# Patient Record
Sex: Male | Born: 1970 | Race: Black or African American | Hispanic: No | Marital: Single | Smoking: Current every day smoker
Health system: Southern US, Community
[De-identification: ages and names within clinical notes are randomized; demographics above are authoritative.]

## PROBLEM LIST (undated history)

## (undated) DIAGNOSIS — I429 Cardiomyopathy, unspecified: Secondary | ICD-10-CM

## (undated) HISTORY — PX: CHOLECYSTECTOMY: SHX55

---

## 2015-11-20 ENCOUNTER — Emergency Department: Payer: Medicaid Other

## 2015-11-20 ENCOUNTER — Encounter: Payer: Self-pay | Admitting: Emergency Medicine

## 2015-11-20 ENCOUNTER — Observation Stay
Admission: EM | Admit: 2015-11-20 | Discharge: 2015-11-21 | Disposition: A | Discharge status: Home / Self Care | Payer: Medicaid Other | Attending: Internal Medicine | Admitting: Internal Medicine

## 2015-11-20 DIAGNOSIS — F17200 Nicotine dependence, unspecified, uncomplicated: Secondary | ICD-10-CM | POA: Diagnosis not present

## 2015-11-20 DIAGNOSIS — Z881 Allergy status to other antibiotic agents status: Secondary | ICD-10-CM | POA: Diagnosis not present

## 2015-11-20 DIAGNOSIS — I422 Other hypertrophic cardiomyopathy: Secondary | ICD-10-CM | POA: Insufficient documentation

## 2015-11-20 DIAGNOSIS — I493 Ventricular premature depolarization: Secondary | ICD-10-CM | POA: Insufficient documentation

## 2015-11-20 DIAGNOSIS — Z9049 Acquired absence of other specified parts of digestive tract: Secondary | ICD-10-CM | POA: Insufficient documentation

## 2015-11-20 DIAGNOSIS — R1011 Right upper quadrant pain: Secondary | ICD-10-CM | POA: Diagnosis present

## 2015-11-20 DIAGNOSIS — R000 Tachycardia, unspecified: Secondary | ICD-10-CM | POA: Diagnosis present

## 2015-11-20 DIAGNOSIS — R778 Other specified abnormalities of plasma proteins: Secondary | ICD-10-CM | POA: Diagnosis not present

## 2015-11-20 DIAGNOSIS — Z950 Presence of cardiac pacemaker: Secondary | ICD-10-CM | POA: Diagnosis not present

## 2015-11-20 DIAGNOSIS — R55 Syncope and collapse: Secondary | ICD-10-CM | POA: Diagnosis not present

## 2015-11-20 DIAGNOSIS — R112 Nausea with vomiting, unspecified: Secondary | ICD-10-CM | POA: Diagnosis not present

## 2015-11-20 DIAGNOSIS — I4581 Long QT syndrome: Secondary | ICD-10-CM | POA: Insufficient documentation

## 2015-11-20 DIAGNOSIS — R531 Weakness: Secondary | ICD-10-CM | POA: Diagnosis not present

## 2015-11-20 DIAGNOSIS — R634 Abnormal weight loss: Secondary | ICD-10-CM | POA: Diagnosis not present

## 2015-11-20 DIAGNOSIS — Z95810 Presence of automatic (implantable) cardiac defibrillator: Secondary | ICD-10-CM | POA: Diagnosis not present

## 2015-11-20 DIAGNOSIS — I517 Cardiomegaly: Secondary | ICD-10-CM | POA: Diagnosis not present

## 2015-11-20 DIAGNOSIS — Z7289 Other problems related to lifestyle: Secondary | ICD-10-CM

## 2015-11-20 DIAGNOSIS — Z789 Other specified health status: Secondary | ICD-10-CM

## 2015-11-20 HISTORY — DX: Cardiomyopathy, unspecified: I42.9

## 2015-11-20 LAB — TROPONIN I: Troponin I: 0.05 ng/mL — ABNORMAL HIGH (ref <0.031)

## 2015-11-20 LAB — COMPREHENSIVE METABOLIC PANEL
ALBUMIN: 4.5 g/dL (ref 3.5–5.0)
ALK PHOS: 65 U/L (ref 38–126)
ALT: 22 U/L (ref 17–63)
ANION GAP: 12 (ref 5–15)
AST: 29 U/L (ref 15–41)
BUN: 7 mg/dL (ref 6–20)
CO2: 23 mmol/L (ref 22–32)
Calcium: 9.1 mg/dL (ref 8.9–10.3)
Chloride: 108 mmol/L (ref 101–111)
Creatinine, Ser: 0.77 mg/dL (ref 0.61–1.24)
GFR calc Af Amer: >60 mL/min (ref >60)
GFR calc non Af Amer: >60 mL/min (ref >60)
GLUCOSE: 82 mg/dL (ref 65–99)
POTASSIUM: 3.6 mmol/L (ref 3.5–5.1)
SODIUM: 143 mmol/L (ref 135–145)
Total Bilirubin: 1.2 mg/dL (ref 0.3–1.2)
Total Protein: 8.1 g/dL (ref 6.5–8.1)

## 2015-11-20 LAB — CBC
HCT: 48.9 % (ref 40.0–52.0)
HEMOGLOBIN: 16.8 g/dL (ref 13.0–18.0)
MCH: 31 pg (ref 26.0–34.0)
MCHC: 34.3 g/dL (ref 32.0–36.0)
MCV: 90.3 fL (ref 80.0–100.0)
Platelets: 194 10*3/uL (ref 150–440)
RBC: 5.41 MIL/uL (ref 4.40–5.90)
RDW: 16 % — ABNORMAL HIGH (ref 11.5–14.5)
WBC: 7.5 10*3/uL (ref 3.8–10.6)

## 2015-11-20 LAB — ETHANOL: ALCOHOL ETHYL (B): 64 mg/dL — AB (ref <5)

## 2015-11-20 LAB — GLUCOSE, CAPILLARY: GLUCOSE-CAPILLARY: 77 mg/dL (ref 65–99)

## 2015-11-20 LAB — LIPASE, BLOOD: Lipase: 34 U/L (ref 11–51)

## 2015-11-20 MED ORDER — METOCLOPRAMIDE HCL 5 MG/ML IJ SOLN
10.0000 mg | Freq: Once | INTRAMUSCULAR | Status: AC
  Administered 2015-11-20: 10 mg via INTRAVENOUS
  Filled 2015-11-20: qty 2

## 2015-11-20 MED ORDER — SODIUM CHLORIDE 0.9 % IV BOLUS (SEPSIS)
1000.0000 mL | Freq: Once | INTRAVENOUS | Status: AC
  Administered 2015-11-20: 1000 mL via INTRAVENOUS

## 2015-11-20 MED ORDER — FENTANYL CITRATE (PF) 100 MCG/2ML IJ SOLN: 50.0000 ug | Freq: Once | INTRAMUSCULAR | Status: DC

## 2015-11-20 MED ORDER — ONDANSETRON HCL 4 MG/2ML IJ SOLN
4.0000 mg | Freq: Once | INTRAMUSCULAR | Status: AC
  Administered 2015-11-20: 4 mg via INTRAVENOUS
  Filled 2015-11-20: qty 2

## 2015-11-20 NOTE — ED Notes (Signed)
Pt presents to ED with c/o "falling asleep". Pt states his left side and lower leg feel numb at times. Pt states n/v at times. Per EMS pt was keeping his eyelids closed prior to arrival. Alert and oriented x3

## 2015-11-20 NOTE — ED Notes (Signed)
Pt lethargic at times ; each "passing out event" lasts 5-10 minutes then he wakes up and answers questions appropriately while maintaining eye contact. Pt asks for ice or covers then goes to sleep or lays quietly.

## 2015-11-20 NOTE — ED Provider Notes (Signed)
[facility] Emergency Department Provider Note  ____________________________________________  Time seen: Approximately 9:43 PM  I have reviewed the triage vital signs and the nursing notes.   HISTORY  Chief Complaint Weakness  History is limited because the patient has a bizarre affect and gives a different chief complaint to every person who asks him why he is here.  HPI Matthew Cowan is a 45 y.o. male with a history of cardiomyopathy status post AICD, S/P cholecystectomy, presenting with right upper quadrant pain associated with nausea vomiting and diarrhea, and possibly syncope. The patient describes that this afternoon he had the urge to have a bowel movement and when he was in the bathroom he began to have nausea and vomiting associated with a sharp right upper quadrant pain. This is similar to previous episodes that he has had since his cholecystectomy, but he is unable to tell me when his surgery was. His mother arrives and states that he had an episode of syncope. She reports that he has been having recurrent syncope" all his doctors at [facility] in [place] don't know why." His mother also reports drug abuse The patient denies any chest pain, palpitations, shortness of breath or lightheadedness. No fevers or chills.  To my triage nurse, he complained of left sided numbness, and he denied this to me.  Past Medical History  Diagnosis Date  . Cardiomyopathy (HCC)     There are no active problems to display for this patient.   Past Surgical History  Procedure Laterality Date  . Cholecystectomy      No current outpatient prescriptions on file.  Allergies Review of patient's allergies indicates not on file.  History reviewed. No pertinent family history.  Social History Social History  Substance Use Topics  . Smoking status: Current Every Day Smoker  . Smokeless tobacco: None  . Alcohol Use: Yes    Review of Systems Constitutional: No  fever/chills.Possible syncope. Eyes: No visual changes. ENT: No sore throat. No congestion or rhinorrhea. Cardiovascular: Denies chest pain. Denies palpitations. Respiratory: Denies shortness of breath.  No cough. Gastrointestinal: Right upper quadrant abdominal pain.  Positive nausea, positive vomiting.  Positive diarrhea.  No constipation. Genitourinary: Negative for dysuria. Musculoskeletal: Negative for back pain. Skin: Negative for rash. Neurological: Negative for headaches. No focal numbness, tingling or weakness.   10-point ROS otherwise negative.  ____________________________________________   PHYSICAL EXAM:  VITAL SIGNS: ED Triage Vitals  Enc Vitals Group     BP 11/20/15 2135 144/93 mmHg     Pulse Rate 11/20/15 2135 77     Resp 11/20/15 2135 20     Temp 11/20/15 2135 98.2 F (36.8 C)     Temp Source 11/20/15 2135 Oral     SpO2 11/20/15 2135 100 %     Weight 11/20/15 2135 180 lb (81.647 kg)     Height 11/20/15 2135  (1.803 m)     Head Cir --      Peak Flow --      Pain Score --      Pain Loc --      Pain Edu? --      Excl. in GC? --     Constitutional: Alert and oriented. Well appearing and in no acute distress. Answers questions appropriately. Eyes: Conjunctivae are normal.  EOMI. No scleral icterus. Head: Atraumatic. Nose: No congestion/rhinnorhea. Mouth/Throat: Mucous membranes are moist.  Neck: No stridor.  Supple.  No meningismus. Full range of motion without pain. Cardiovascular: Normal rate, regular rhythm.  No murmurs, rubs or gallops.  Respiratory: Normal respiratory effort.  No accessory muscle use or retractions. Lungs CTAB.  No wheezes, rales or ronchi. Gastrointestinal: Soft and nondistended.  Mild right upper quadrant tenderness to palpation without Murphy sign. No guarding or rebound.  No peritoneal signs. Musculoskeletal: No LE edema. No ttp in the calves or palpable cords.  Negative Homan's sign. Neurologic:  Alert and oriented 3. Speech  is clear. Face and smile are symmetric. Moves all extremities well.  Skin:  Skin is warm, dry and intact. No rash noted. Psychiatric: Bizarre affect with abnormal mood. The patient closes his eyes for most of the exam and at times he refuses to answer questions although he is hemodynamically stable during those periods of time. He occasionally blurts out profanities. And at different times has the apparent acute onset of pain in multiple different locations of his body that are not consistent.  ____________________________________________   LABS (all labs ordered are listed, but only abnormal results are displayed)  Labs Reviewed  CBC - Abnormal; Notable for the following:    RDW 16.0 (*)    All other components within normal limits  ETHANOL - Abnormal; Notable for the following:    Alcohol, Ethyl (B) 64 (*)    All other components within normal limits  GLUCOSE, CAPILLARY  COMPREHENSIVE METABOLIC PANEL  LIPASE, BLOOD  TROPONIN I  URINE DRUG SCREEN, QUALITATIVE ([facility] ONLY)   ____________________________________________  EKG  ED ECG REPORT I, Rockne MenghiniNorman, Anne-Caroline, the attending physician, personally viewed and interpreted this ECG.   Date: 11/20/2015  EKG Time: 2123  Rate: 82  Rhythm:  paced  Axis: paced  Intervals:none  ST&T Change: paced  ____________________________________________  RADIOLOGY  Dg Chest Portable 1 View  11/20/2015  CLINICAL DATA:  45 year old male with syncope EXAM: PORTABLE CHEST 1 VIEW COMPARISON:  None FINDINGS: Single-view of the chest demonstrate clear lungs. There is no pleural effusion or pneumothorax. There is mild cardiomegaly. Left pectoral pacemaker device. No acute osseous pathology. IMPRESSION: No acute pulmonary process. Mild cardiomegaly. Electronically Signed   By: Elgie CollardArash  Radparvar M.D.   On: 11/20/2015 22:02    ____________________________________________   PROCEDURES  Procedure(s) performed: None  Critical Care performed:  No ____________________________________________   INITIAL IMPRESSION / ASSESSMENT AND PLAN / ED COURSE  Pertinent labs & imaging results that were available during my care of the patient were reviewed by me and considered in my medical decision making (see chart for details).  45 y.o. male with a history of CHF and cardiomyopathy status post AICD presenting with right upper quadrant pain, nausea and vomiting, diarrhea, and possible syncopal episode. His EKG does show that his pacer is working with a rate of 82. I will get the nurses to interrogate the pacer. We will get additional blood work, but hold off on imaging at this time.  ----------------------------------------- 11:01 PM on 11/20/2015 -----------------------------------------  The patient abruptly developed a heart rate of 142. Patient was completely asymptomatic during this episode and it was brief and self-limited. The following is the EKG interpretation: ED ECG REPORT I, Rockne MenghiniNorman, Anne-Caroline, the attending physician, personally viewed and interpreted this ECG.   Date: 11/20/2015  EKG Time: 2249  Rate: 142  Rhythm: sinus tachycardia  Axis: Normal  Intervals:Prolonged QTC  ST&T Change: Isolated 2 mm ST elevation in V3.  Upon immediately going to the patient's room, his heart rate had now normalized to 70 bpm and he no longer had the morphologic abnormality seen on the brief strip  from his tachycardia. We'll continue to monitor him and admit him to the hospital.   ____________________________________________  FINAL CLINICAL IMPRESSION(S) / ED DIAGNOSES  Final diagnoses:  Alcohol use (HCC)  Syncope, unspecified syncope type  Right upper quadrant abdominal pain  Sinus tachycardia (HCC)      NEW MEDICATIONS STARTED DURING THIS VISIT:  New Prescriptions   No medications on file     Rockne Menghini, MD 11/20/15 2308

## 2015-11-20 NOTE — ED Notes (Signed)
Interrogating pacemaker.

## 2015-11-20 NOTE — H&P (Addendum)
[facility] [place] at [facility]   PATIENT NAME: Matthew Cowan    MR#:  [id number]  DATE OF BIRTH:  1970-09-07  DATE OF ADMISSION:  11/20/2015  PRIMARY CARE PHYSICIAN: Pcp Not In System    REQUESTING/REFERRING PHYSICIAN: Dr Sharma Covert  CHIEF COMPLAINT:   Nausea vomiting today HISTORY OF PRESENT ILLNESS:  Matthew Cowan  is a 45 y.o. 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 Patient reports drinking 3 cans of beer earlier in the day. He denies any drug use to me. He has an episode where he does not respond for a few seconds. His grandma states he does this all the time and is had extensive workup at [facility] and they are not able to determine why he has his episodes where he falls asleep for a few seconds to minutes and then he wakes up by himself. His vitals remained stable during this episode while I was in the room. Patient reports having some trouble breathing however his sats are 100% on room air. He likely is dry heaving which is causing him symptoms of shortness of breath. He had troponin of 0.05. Patient denies any chest pain. He is being admitted for overnight observation for nausea and vomiting and epigastric discomfort.  PAST MEDICAL HISTORY:   Past Medical History  Diagnosis Date  . Cardiomyopathy (HCC)     PAST SURGICAL HISTOIRY:   Past Surgical History  Procedure Laterality Date  . Cholecystectomy      SOCIAL HISTORY:   Social History  Substance Use Topics  . Smoking status: Current Every Day Smoker  . Smokeless tobacco: Not on file  . Alcohol Use: Yes    FAMILY HISTORY:  History reviewed. No pertinent family history.  DRUG ALLERGIES:   Allergies  Allergen Reactions  . Tetracyclines & Related Hives     REVIEW OF SYSTEMS:  Review of Systems  Constitutional: Negative for fever, chills and weight loss.  HENT: Negative for ear discharge, ear pain and nosebleeds.   Eyes: Negative for blurred vision, pain and discharge.  Respiratory: Negative for sputum production, shortness of breath, wheezing and stridor.   Cardiovascular: Negative for chest pain, palpitations, orthopnea and PND.  Gastrointestinal: Positive for nausea and vomiting. Negative for abdominal pain and diarrhea.  Genitourinary: Negative for urgency and frequency.  Musculoskeletal: Positive for back pain. Negative for joint pain.  Neurological: Positive for weakness. Negative for sensory change, speech change and focal weakness.  Psychiatric/Behavioral: Negative for depression and hallucinations. The patient is not nervous/anxious.   All other systems reviewed and are negative.    MEDICATIONS AT HOME:   Prior to Admission medications   Medication Sig Start Date End Date Taking? Authorizing Provider  carvedilol (COREG) 12.5 MG tablet Take 12.5 mg by mouth 2 (two) times daily with a meal.   Yes Historical Provider, MD  furosemide (LASIX) 40 MG tablet Take 40 mg by mouth daily.   Yes Historical Provider, MD  losartan (COZAAR) 50 MG tablet Take 50 mg by mouth daily.   Yes Historical Provider, MD  mirtazapine (REMERON) 15 MG tablet Take 15 mg by mouth at bedtime.   Yes Historical Provider, MD  sotalol (BETAPACE) 120 MG tablet Take 120 mg by mouth 2 (two) times daily.   Yes Historical  Provider, MD  spironolactone (ALDACTONE) 25 MG tablet Take 25 mg by mouth daily.   Yes Historical Provider, MD  sucralfate (CARAFATE) 1 g tablet Take 1 g by mouth 2 (two) times daily.   Yes Historical Provider, MD      VITAL SIGNS:  Blood pressure 155/100, pulse 66, temperature 98.2 F (36.8 C), temperature source Oral, resp. rate 11, height  (1.803 m), weight 81.647 kg (180 lb), SpO2 85 %.  PHYSICAL EXAMINATION:  GENERAL:  45  y.o.-year-old patient lying in the bed with no acute distress.  EYES: Pupils equal, round, reactive to light and accommodation. No scleral icterus. Extraocular muscles intact.  HEENT: Head atraumatic, normocephalic. Oropharynx and nasopharynx clear.  NECK:  Supple, no jugular venous distention. No thyroid enlargement, no tenderness.  LUNGS: Normal breath sounds bilaterally, no wheezing, rales,rhonchi or crepitation. No use of accessory muscles of respiration.  CARDIOVASCULAR: S1, S2 normal. No murmurs, rubs, or gallops.  ABDOMEN: Soft, nontender, nondistended. Bowel sounds present. No organomegaly or mass.  EXTREMITIES: No pedal edema, cyanosis, or clubbing.  NEUROLOGIC: Cranial nerves II through XII are intact. Muscle strength 5/5 in all extremities. Sensation intact. Gait not checked.  PSYCHIATRIC: The patient is alert and oriented x 3.  SKIN: No obvious rash, lesion, or ulcer.   LABORATORY PANEL:   CBC  Recent Labs Lab 11/20/15 2240  WBC 7.5  HGB 16.8  HCT 48.9  PLT 194   ------------------------------------------------------------------------------------------------------------------  Chemistries   Recent Labs Lab 11/20/15 2240  NA 143  K 3.6  CL 108  CO2 23  GLUCOSE 82  BUN 7  CREATININE 0.77  CALCIUM 9.1  AST 29  ALT 22  ALKPHOS 65  BILITOT 1.2   ------------------------------------------------------------------------------------------------------------------  Cardiac Enzymes  Recent Labs Lab 11/20/15 2240  TROPONINI 0.05*   ------------------------------------------------------------------------------------------------------------------  RADIOLOGY:  Dg Chest Portable 1 View  11/20/2015  CLINICAL DATA:  45 year old male with syncope EXAM: PORTABLE CHEST 1 VIEW COMPARISON:  None FINDINGS: Single-view of the chest demonstrate clear lungs. There is no pleural effusion or pneumothorax. There is mild cardiomegaly. Left pectoral pacemaker device. No acute  osseous pathology. IMPRESSION: No acute pulmonary process. Mild cardiomegaly. Electronically Signed   By: Elgie Collard M.D.   On: 11/20/2015 22:02    EKG:  First EKG shows atrial paced complexes. Multiple PVCs. Right axis deviation. Second EKG shows sinus tachycardia heart rate in the 140s. PVCs.  IMPRESSION AND PLAN:   Jourdin Connors  is a 45 y.o. male with a known history ofCardiac myopathy status post AICD, history of cholecystectomy comes to the emergency room after having nausea or vomiting today while he was visiting his grandmother's place of [place]. Patient reports drinking 3 cans of beer earlier in the day. He denies any drug use to me.   1. Intractable nausea vomiting Admit to medical floor withoff unit telemetry IV fluids for hydration. When necessary Reglan/Zofran.  2. History of cardiomyopathy status post AICD\ Patient's defibrillator was interrogated and no major activity was noted.  3. Transient tachycardia in the emergency room with mild elevated troponin 0.05 -No history of chest pain -We'll trend cardiac enzymes 2. -Consider cardiology consultation if needed  4. Positive blood alcohol level. Patient reports drinking 3 beers today. Family reported earlier to the ER physician he has history of drug abuse.  Urine drug screen pending.  5. DVT prophylaxis subcutaneous Lovenox  Above was discussed with patient patient's grandmother and girlfriend was present emergency room All the records are reviewed and case  discussed with ED provider. Management plans discussed with the patient, family and they are in agreement.  CODE STATUS: Full  TOTAL TIME TAKING CARE OF THIS PATIENT: 45 minutes.    Aureliano Oshields M.D on 11/20/2015 at 11:44 PM  Between 7am to 6pm - Pager - [phone]  After 6pm go to [email or web address] - password [email or web address]  Fabio Neighborsagle  Hospitalists  Office  [phone]  CC: Primary care physician; Pcp Not In System

## 2015-11-21 LAB — URINE DRUG SCREEN, QUALITATIVE (ARMC ONLY)
Amphetamines, Ur Screen: NOT DETECTED
BARBITURATES, UR SCREEN: NOT DETECTED
BENZODIAZEPINE, UR SCRN: NOT DETECTED
Cannabinoid 50 Ng, Ur ~~LOC~~: POSITIVE — AB
Cocaine Metabolite,Ur ~~LOC~~: NOT DETECTED
MDMA (Ecstasy)Ur Screen: NOT DETECTED
METHADONE SCREEN, URINE: NOT DETECTED
OPIATE, UR SCREEN: NOT DETECTED
PHENCYCLIDINE (PCP) UR S: NOT DETECTED
Tricyclic, Ur Screen: NOT DETECTED

## 2015-11-21 LAB — TROPONIN I
TROPONIN I: 0.06 ng/mL — AB (ref <0.031)
TROPONIN I: 0.05 ng/mL — AB (ref <0.031)

## 2015-11-21 MED ORDER — OXYCODONE HCL 5 MG PO TABS
5.0000 mg | ORAL_TABLET | ORAL | Status: DC | PRN
  Administered 2015-11-21: 5 mg via ORAL
  Filled 2015-11-21: qty 1

## 2015-11-21 MED ORDER — ACETAMINOPHEN 650 MG RE SUPP: 650.0000 mg | Freq: Four times a day (QID) | RECTAL | Status: DC | PRN

## 2015-11-21 MED ORDER — MIRTAZAPINE 15 MG PO TABS
15.0000 mg | ORAL_TABLET | Freq: Every day | ORAL | Status: DC
  Administered 2015-11-21: 15 mg via ORAL
  Filled 2015-11-21: qty 1

## 2015-11-21 MED ORDER — ENOXAPARIN SODIUM 40 MG/0.4ML ~~LOC~~ SOLN: 40.0000 mg | SUBCUTANEOUS | Status: DC

## 2015-11-21 MED ORDER — SUCRALFATE 1 G PO TABS
1.0000 g | ORAL_TABLET | Freq: Two times a day (BID) | ORAL | Status: DC
  Administered 2015-11-21 (×2): 1 g via ORAL
  Filled 2015-11-21 (×2): qty 1

## 2015-11-21 MED ORDER — SOTALOL HCL 120 MG PO TABS
120.0000 mg | ORAL_TABLET | Freq: Two times a day (BID) | ORAL | Status: DC
  Administered 2015-11-21 (×2): 120 mg via ORAL
  Filled 2015-11-21 (×2): qty 1

## 2015-11-21 MED ORDER — CARVEDILOL 12.5 MG PO TABS
12.5000 mg | ORAL_TABLET | Freq: Two times a day (BID) | ORAL | Status: DC
  Administered 2015-11-21: 12.5 mg via ORAL
  Filled 2015-11-21: qty 1

## 2015-11-21 MED ORDER — LOSARTAN POTASSIUM 50 MG PO TABS
50.0000 mg | ORAL_TABLET | Freq: Every day | ORAL | Status: DC
Start: 2015-11-21 — End: 2015-11-21
  Administered 2015-11-21: 50 mg via ORAL
  Filled 2015-11-21: qty 1

## 2015-11-21 MED ORDER — SPIRONOLACTONE 25 MG PO TABS
25.0000 mg | ORAL_TABLET | Freq: Every day | ORAL | Status: DC
  Administered 2015-11-21: 25 mg via ORAL
  Filled 2015-11-21: qty 1

## 2015-11-21 MED ORDER — PANTOPRAZOLE SODIUM 40 MG PO TBEC: 40.0000 mg | DELAYED_RELEASE_TABLET | Freq: Two times a day (BID) | ORAL | Status: AC

## 2015-11-21 MED ORDER — ACETAMINOPHEN 325 MG PO TABS: 650.0000 mg | ORAL_TABLET | Freq: Four times a day (QID) | ORAL | Status: DC | PRN

## 2015-11-21 MED ORDER — SODIUM CHLORIDE 0.9 % IV SOLN
Freq: Once | INTRAVENOUS | Status: AC
  Administered 2015-11-21: 01:00:00 via INTRAVENOUS

## 2015-11-21 MED ORDER — ONDANSETRON HCL 4 MG PO TABS: 4.0000 mg | ORAL_TABLET | Freq: Four times a day (QID) | ORAL | Status: DC | PRN

## 2015-11-21 MED ORDER — ONDANSETRON HCL 4 MG/2ML IJ SOLN: 4.0000 mg | Freq: Four times a day (QID) | INTRAMUSCULAR | Status: DC | PRN

## 2015-11-21 NOTE — Plan of Care (Signed)
Problem: Safety: Goal: Ability to remain free from injury will improve Outcome: Progressing Patient will call for help

## 2015-11-21 NOTE — Progress Notes (Signed)
Patient discharged via wheelchair and private vehicle. IV removed and catheter intact. All discharge instructions given and patient verbalizes understanding. Tele removed and returned. Prescriptions given to patient No distress noted.   

## 2015-11-21 NOTE — ED Notes (Signed)
Transporting patient to 2A 246

## 2015-11-21 NOTE — Progress Notes (Signed)
Patient admitted to room 246 with the diagnosis of N/V. Alert and oriented x 4. Denied any acute pain. No respiratory distress noted.  Patient oriented to his room staff ascom/call bell. Tele box verified by the RN and  Misty StanleyLisa. R. RN. Skin assessment done with Roderick PeeLisa R. RN. No skin issues of concern noted. Girlfriend at bedside. Will continue to monitor.

## 2015-11-21 NOTE — Progress Notes (Signed)
Initial Nutrition Assessment  DOCUMENTATION CODES:   Not applicable  INTERVENTION:   -Cater to pt preferences on 2g Na diet order -Will send [employer] Instant Breakfast daily for added nutrition   NUTRITION DIAGNOSIS:   Unintentional weight loss related to altered GI function as evidenced by per patient/family report, percent weight loss.  GOAL:   Patient will meet greater than or equal to 90% of their needs  MONITOR:   PO intake, Supplement acceptance, Labs, I & O's, Weight trends  REASON FOR ASSESSMENT:   Malnutrition Screening Tool    ASSESSMENT:   Pt is 45 y.o. male with a known history ofCardiac myopathy status post AICD, history of cholecystectomy admitted after having nausea or vomiting  Past Medical History  Diagnosis Date  . Cardiomyopathy [facility](HCC)     Diet Order:  Diet 2 gram sodium Room service appropriate?: Yes; Fluid consistency:: Thin  Pt ate 0% of breakfast this morning, has been sipping liquids per report.   Pt reports appetite has been good PTA but he has not been able to keep what he eats down. Pt reports that it has been off and on since he had his gall bladder removed in 2014 but worse PTA.   Medications: Carafate, Remeron  Labs: reviewed   Gastrointestinal Profile: pt reports no nausea on visit Last BM:  11/19/2015   Nutrition-Focused Physical Exam Findings: Nutrition-Focused physical exam completed. Findings are WDL for fat depletion, muscle depletion, and edema.    Weight Change: Pt reports weight of 326lbs in 2014 prior to gallbladder removal (46% weight loss in 3 years).  Pt reports more recently having a weight of 180lbs 2 months ago (3% weight loss in 2 months)   Skin:  Reviewed, no issues   Height:   Ht Readings from Last 1 Encounters:  11/20/15 5\' 11"  (1.803 m)    Weight:   Wt Readings from Last 1 Encounters:  11/21/15 175 lb 3.2 oz (79.47 kg)     BMI:  Body mass index is 24.45 kg/(m^2).  Estimated Nutritional  Needs:   Kcal:  1975-2370kcals  Protein:  63-80g protein  Fluid:  >2L fluid  EDUCATION NEEDS:   No education needs identified at this time  Leda QuailAllyson Shaunita Seney, RD, LDN Pager [phone] Weekend/On-Call Pager [phone]

## 2015-11-21 NOTE — Discharge Instructions (Signed)
°  DIET:  Cardiac diet  DISCHARGE CONDITION:  Stable  ACTIVITY:  Activity as tolerated  OXYGEN:  Home Oxygen: No.   Oxygen Delivery: room air  DISCHARGE LOCATION:  home   If you experience worsening of your admission symptoms, develop shortness of breath, life threatening emergency, suicidal or homicidal thoughts you must seek medical attention immediately by calling 911 or calling your MD immediately  if symptoms less severe.   QUIT ALCOHOL.  You will need an endoscopy if symptoms do not improve in 4 weeks. This can be set up by you primary physician.

## 2015-11-23 NOTE — Discharge Summary (Signed)
[facility] [place] at [facility]   PATIENT NAME: Matthew HouseholderGregory Cowan    MR#:  [id number]  DATE OF BIRTH:  Oct 16, 1970  DATE OF ADMISSION:  11/20/2015 ADMITTING PHYSICIAN: Enedina FinnerSona Patel, MD  DATE OF DISCHARGE: 11/21/2015 11:49 AM  PRIMARY CARE PHYSICIAN: Pcp Not In System   ADMISSION DIAGNOSIS:  Sinus tachycardia (HCC) [R00.0] Alcohol use (HCC) [Z78.9] Right upper quadrant abdominal pain [R10.11] Syncope, unspecified syncope type [R55]  DISCHARGE DIAGNOSIS:  Active Problems:   Nausea and vomiting   SECONDARY DIAGNOSIS:   Past Medical History  Diagnosis Date  . Cardiomyopathy [facility](HCC)      ADMITTING HISTORY  Matthew HouseholderGregory Mackowski is a 45 y.o. male with a known history of Familial hypertrophic Cardiac myopathy status [facility] and history of cholecystectomy comes to the emergency room after having nausea or vomiting today while he was visiting his grandmother's place of [place]. Patient reports drinking 3 cans of beer earlier in the day. He denies any drug use to me. He has an episode where he does not respond for a few seconds. His grandma states he does this all the time and is [facility] and they are not able to determine why he has his episodes where he falls asleep for a few seconds to minutes and then he wakes up by himself. His vitals remained stable during this episode while I was in the room. Patient reports having some trouble breathing however his sats are 100% on room air. He likely is dry heaving which is causing him symptoms of shortness of breath. He had troponin of 0.05. Patient denies any chest pain. He is being admitted for overnight observation for nausea and vomiting and epigastric discomfort.  HOSPITAL COURSE:   *  GERD  patient was treated with symptomatic management. He likely had gastritis due to alcohol intake.  Presently plan is for trial of PPIs for 4 weeks and EGD no improvement.  Counseled to  quit marijuana and alcohol.  *  Mild elevation of troponin  0.04. Stable on serial troponin. No EKG changes. His pain was a burning sensation likely due to his reflux and GERD. Resolve by the time of discharge.   Stable for discharge home. Followup with PCP. May need referral for EGD if no improvement.  CONSULTS OBTAINED:     DRUG ALLERGIES:   Allergies  Allergen Reactions  . Tetracyclines & Related Hives    DISCHARGE MEDICATIONS:   Discharge Medication List as of 11/21/2015 10:28 AM    START taking these medications   Details  pantoprazole (PROTONIX) 40 MG tablet Take 1 tablet (40 mg total) by mouth 2 (two) times daily before a meal., Starting 11/21/2015, Until Discontinued, Print      CONTINUE these medications which have NOT CHANGED   Details  carvedilol (COREG) 12.5 MG tablet Take 12.5 mg by mouth 2 (two) times daily with a meal., Until Discontinued, Historical Med    furosemide (LASIX) 40 MG tablet Take 40 mg by mouth daily., Until Discontinued, Historical Med    losartan (COZAAR) 50 MG tablet Take 50 mg by mouth daily., Until Discontinued, Historical Med    mirtazapine (REMERON) 15 MG tablet Take 15 mg by mouth at bedtime., Until Discontinued, Historical Med    sotalol (BETAPACE) 120 MG tablet Take 120 mg by mouth 2 (two) times daily., Until Discontinued, Historical Med    spironolactone (ALDACTONE) 25 MG tablet Take 25 mg by mouth  daily., Until Discontinued, Historical Med    sucralfate (CARAFATE) 1 g tablet Take 1 g by mouth 2 (two) times daily., Until Discontinued, Historical Med        Today   VITAL SIGNS:  Blood pressure 145/82, pulse 71, temperature 97.6 F (36.4 C), temperature source Oral, resp. rate 13, height  (1.803 m), weight 79.47 kg (175 lb 3.2 oz), SpO2 92 %.  I/O:  No intake or output data in the 24 hours ending 11/23/15 1332  PHYSICAL EXAMINATION:  Physical Exam  GENERAL:  45 y.o.-year-old patient lying in the bed with no acute  distress.  LUNGS: Normal breath sounds bilaterally, no wheezing, rales,rhonchi or crepitation. No use of accessory muscles of respiration.  CARDIOVASCULAR: S1, S2 normal. No murmurs, rubs, or gallops.  ABDOMEN: Soft, non-tender, non-distended. Bowel sounds present. No organomegaly or mass.  NEUROLOGIC: Moves all 4 extremities. PSYCHIATRIC: The patient is alert and oriented x 3.  SKIN: No obvious rash, lesion, or ulcer.   DATA REVIEW:   CBC  Recent Labs Lab 11/20/15 2240  WBC 7.5  HGB 16.8  HCT 48.9  PLT 194    Chemistries   Recent Labs Lab 11/20/15 2240  NA 143  K 3.6  CL 108  CO2 23  GLUCOSE 82  BUN 7  CREATININE 0.77  CALCIUM 9.1  AST 29  ALT 22  ALKPHOS 65  BILITOT 1.2    Cardiac Enzymes  Recent Labs Lab 11/21/15 0740  TROPONINI 0.05*    Microbiology Results  No results found for this or any previous visit.  RADIOLOGY:  No results found.  Follow up with PCP in 1 week.  Management plans discussed with the patient, family and they are in agreement.  CODE STATUS:  Code Status History    Date Active Date Inactive Code Status Order ID Comments User Context   11/21/2015 12:57 AM 11/21/2015  2:49 PM Full Code [id number]  Enedina Finner, MD Inpatient      TOTAL TIME TAKING CARE OF THIS PATIENT ON DAY OF DISCHARGE: more than 30 minutes.   Milagros Loll R M.D on 11/23/2015 at 1:32 PM  Between 7am to 6pm - Pager - [phone]  After 6pm go to [email or web address] - password [email or web address]  [facility]  Office  [phone]  CC: Primary care physician; Pcp Not In System  Note: This dictation was prepared with Dragon dictation along with smaller phrase technology. Any transcriptional errors that result from this process are unintentional.

## 2016-11-13 DEATH — deceased

## 2017-01-30 IMAGING — DX DG CHEST 1V PORT
2 series · 2 of 2 positions shown · non-contrast
Comparison: None

CLINICAL DATA: 44-year-old male with syncope

EXAM:
PORTABLE CHEST 1 VIEW

[chest ap (1 of 2)]
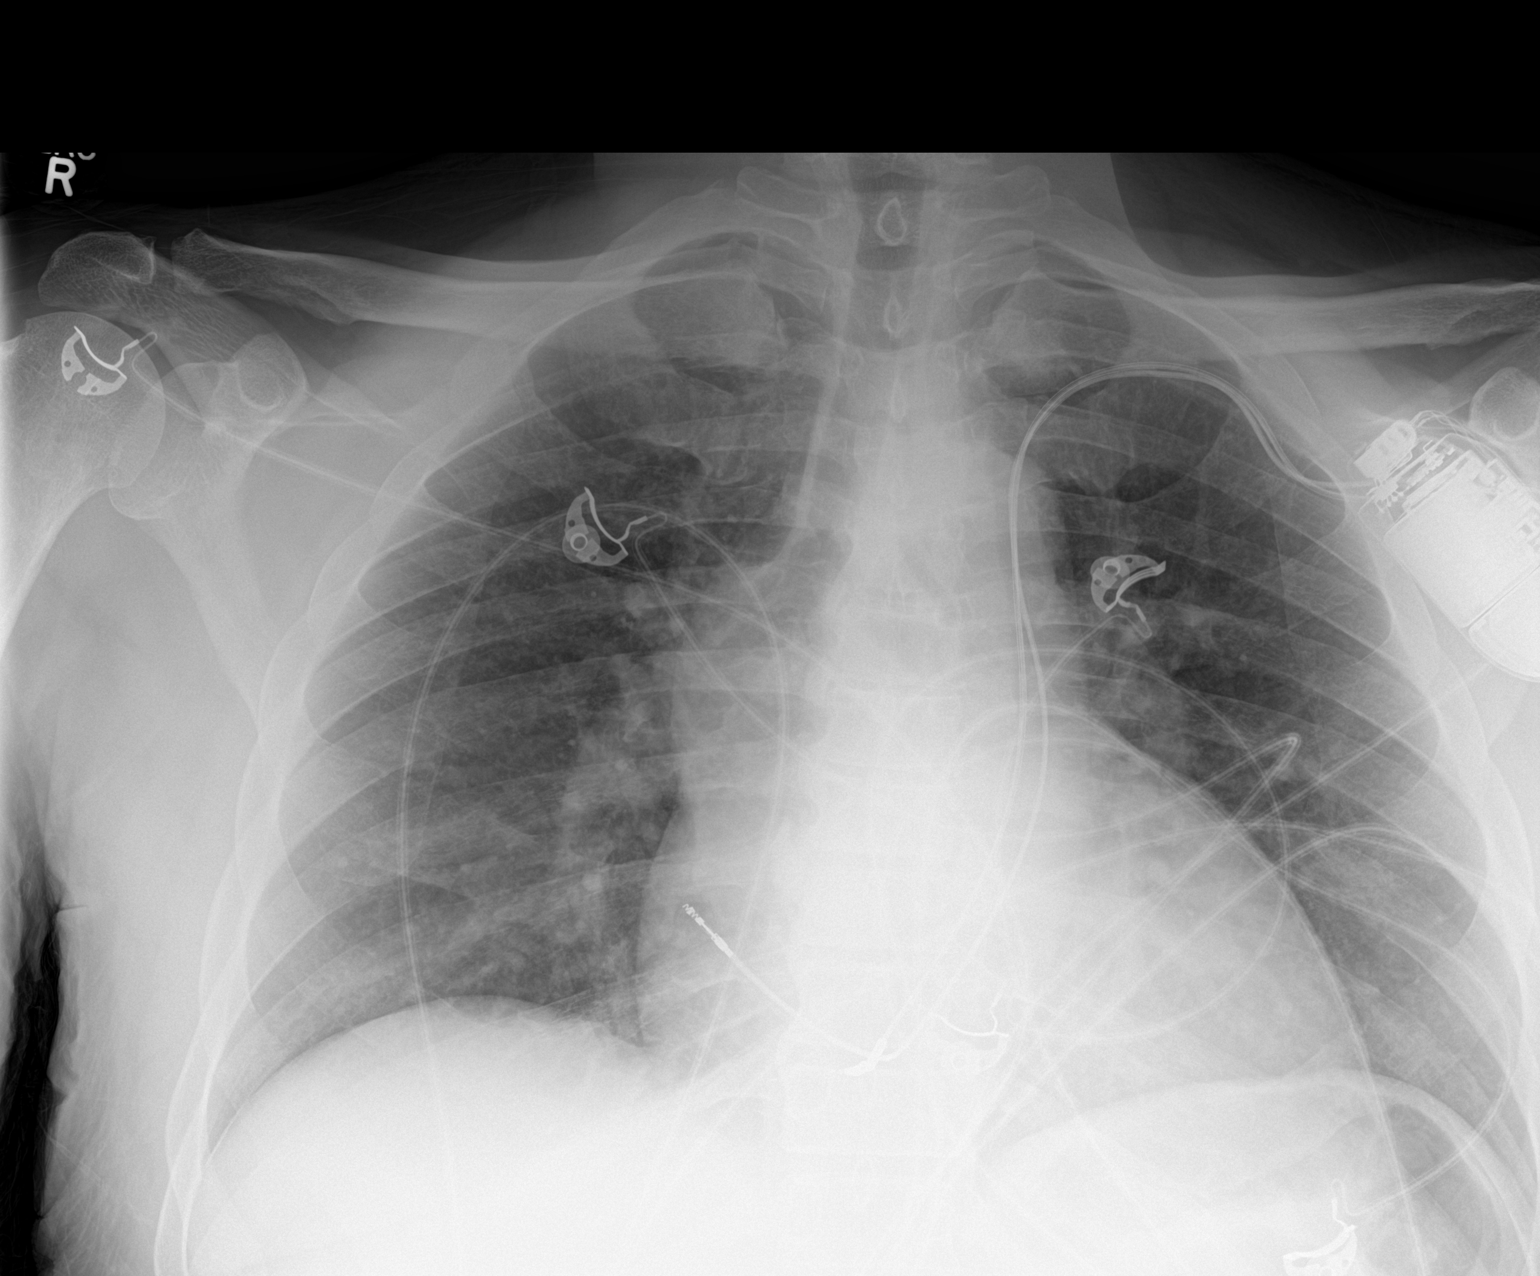

[chest ap (2 of 2)]
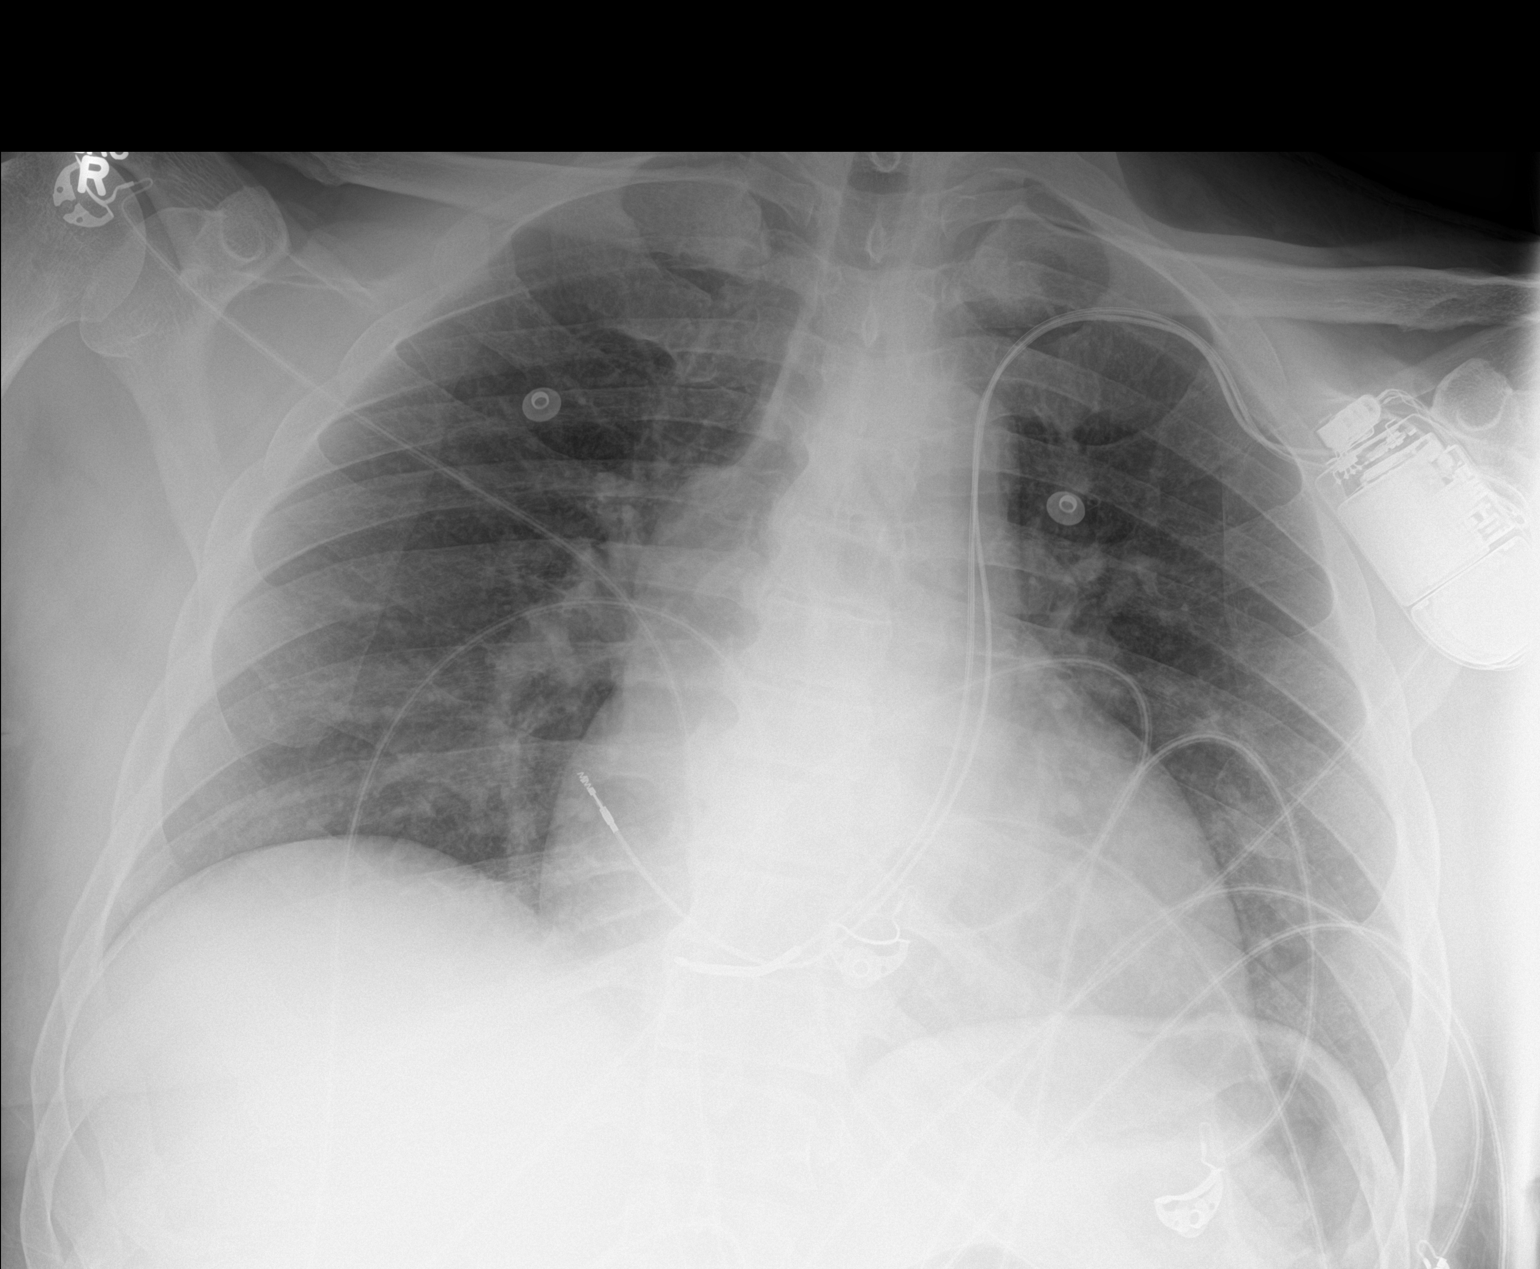

[2 of 2 positions shown; findings below may reference images not displayed]

FINDINGS: Single-view of the chest demonstrate clear lungs. There is no
pleural effusion or pneumothorax. There is mild cardiomegaly. Left
pectoral pacemaker device. No acute osseous pathology.
IMPRESSION: No acute pulmonary process.

Mild cardiomegaly.

## 2019-11-14 DEATH — deceased
